# Patient Record
Sex: Female | Born: 1980 | Race: White | Hispanic: No | Marital: Married | Smoking: Never smoker
Health system: Southern US, Community
[De-identification: ages and names within clinical notes are randomized; demographics above are authoritative.]

---

## 2006-04-19 ENCOUNTER — Inpatient Hospital Stay: Payer: Self-pay | Admitting: Unknown Physician Specialty

## 2007-02-11 ENCOUNTER — Ambulatory Visit: Payer: Self-pay | Admitting: Gynecology

## 2007-02-17 ENCOUNTER — Ambulatory Visit (HOSPITAL_COMMUNITY): Admission: RE | Admit: 2007-02-17 | Discharge: 2007-02-17 | Payer: Self-pay | Admitting: Gynecology

## 2007-02-24 ENCOUNTER — Ambulatory Visit: Payer: Self-pay | Admitting: Family Medicine

## 2007-02-24 ENCOUNTER — Encounter (INDEPENDENT_AMBULATORY_CARE_PROVIDER_SITE_OTHER): Payer: Self-pay | Admitting: Gynecology

## 2007-03-24 ENCOUNTER — Ambulatory Visit: Payer: Self-pay | Admitting: Family Medicine

## 2007-04-09 ENCOUNTER — Ambulatory Visit (HOSPITAL_COMMUNITY): Admission: RE | Admit: 2007-04-09 | Discharge: 2007-04-09 | Payer: Self-pay | Admitting: Gynecology

## 2007-04-20 ENCOUNTER — Ambulatory Visit: Payer: Self-pay | Admitting: Gynecology

## 2007-05-01 ENCOUNTER — Ambulatory Visit (HOSPITAL_COMMUNITY): Admission: RE | Admit: 2007-05-01 | Discharge: 2007-05-01 | Payer: Self-pay | Admitting: Gynecology

## 2007-05-11 ENCOUNTER — Ambulatory Visit (HOSPITAL_COMMUNITY): Admission: RE | Admit: 2007-05-11 | Discharge: 2007-05-11 | Payer: Self-pay | Admitting: Gynecology

## 2007-05-18 ENCOUNTER — Ambulatory Visit: Payer: Self-pay | Admitting: Gynecology

## 2007-06-12 ENCOUNTER — Ambulatory Visit (HOSPITAL_COMMUNITY): Admission: RE | Admit: 2007-06-12 | Discharge: 2007-06-12 | Payer: Self-pay | Admitting: Gynecology

## 2007-06-15 ENCOUNTER — Ambulatory Visit: Payer: Self-pay | Admitting: Gynecology

## 2007-07-15 ENCOUNTER — Ambulatory Visit: Payer: Self-pay | Admitting: Obstetrics & Gynecology

## 2007-08-06 ENCOUNTER — Ambulatory Visit: Payer: Self-pay | Admitting: Gynecology

## 2007-08-24 ENCOUNTER — Ambulatory Visit: Payer: Self-pay | Admitting: Gynecology

## 2007-09-01 ENCOUNTER — Inpatient Hospital Stay (HOSPITAL_COMMUNITY): Admission: AD | Admit: 2007-09-01 | Discharge: 2007-09-04 | Payer: Self-pay | Admitting: Obstetrics and Gynecology

## 2007-09-01 ENCOUNTER — Encounter: Payer: Self-pay | Admitting: Family Medicine

## 2007-09-01 ENCOUNTER — Ambulatory Visit: Payer: Self-pay | Admitting: Family Medicine

## 2007-10-12 ENCOUNTER — Ambulatory Visit: Payer: Self-pay | Admitting: Gynecology

## 2007-10-12 ENCOUNTER — Encounter (INDEPENDENT_AMBULATORY_CARE_PROVIDER_SITE_OTHER): Payer: Self-pay | Admitting: Gynecology

## 2009-03-07 ENCOUNTER — Encounter: Payer: Self-pay | Admitting: Obstetrics & Gynecology

## 2009-03-07 ENCOUNTER — Ambulatory Visit: Payer: Self-pay | Admitting: Obstetrics & Gynecology

## 2009-08-01 ENCOUNTER — Ambulatory Visit: Payer: Self-pay | Admitting: Family Medicine

## 2010-09-11 NOTE — Op Note (Signed)
NAMEGABRIANA, [name]              ACCOUNT NO.:  [id number]   MEDICAL RECORD NO.:  [id number]          PATIENT TYPE:  INP   LOCATION:  9102                          FACILITY:  [facility]   PHYSICIAN:  Tanya S. Shawnie Pons, M.D.   DATE OF BIRTH:  [birth year]   DATE OF PROCEDURE:  09/01/2007  DATE OF DISCHARGE:                               OPERATIVE REPORT   PREOPERATIVE DIAGNOSES:  1. Intrauterine pregnancy at 34-3/7th weeks.  2. Preterm labor, transverse lie, previous cesarean section.  3. Undesired fertility.   POSTOPERATIVE DIAGNOSES:  1. Intrauterine pregnancy at 34-3/7th weeks.  2. Preterm labor, transverse lie, previous cesarean section.  3. Undesired fertility.   PROCEDURE:  Repeat vertical cesarean section and bilateral tubal  ligation.   SURGEON:  Shelbie Proctor. Shawnie Pons, MD   ASSISTANT:  None.   ANESTHESIA:  Spinal local, Quillian Quince, MD.   FINDINGS:  Viable female infant, Apgars 9 and 9, and weight 6-1/2 pounds.   SPECIMENS:  Placenta to pathology.   ESTIMATED BLOOD LOSS:  1000 mL.   COMPLICATIONS:  None known.   REASON FOR PROCEDURE:  Briefly, the patient is a 30 year old gravida 2,  para 1, who is status post previous C-section for breech preterm labor  at 36 weeks, who came in with acute onset of painful uterine  contractions, to start the day she was monitored for 6 hours and had a  little cervical change but continued painful contractions every 2-4  minutes.  On scanning, it was noted that the baby was transverse lie  back down.  There was no driving wedge to stop labor.  Given the  uncomfortableness and that she made some change, it was felt that she  was probably in early labor; and given her gestational age, it was not  felt she was the candidate for tocolysis.   PROCEDURE:  The patient was taken to the OR.  She was placed in the  supine position on the left lateral tilt.  She was prepped and draped in  usual sterile fashion.  A Foley catheter was placed at the  bladder.  When anesthesia was felt to be adequate, low-transverse incision was  made through a previous scar.  This incision was carried down to the  underlying fascia, which was incised in the midline with electrocautery.  Fascial extension was ensured with scissors.  Kocher clamps were used to  elevate the fascia off the underlying rectus muscle superiorly and  inferiorly, which was dissected off bluntly, laterally and with the  electrocautery in the midline.  Peritoneal cavity was entered.  As the  rectus muscles were separated, the peritoneal cavity was entered  bluntly.  Peritoneum was taken down superiorly with electrocautery.  Alexis retractor was then placed inside the incision.  The bladder flap  was created, this was taken down easily; and then, a vertical incision  was made approximately 6.5 cm long in length from the edge of the  bladder flap, which was in low-transverse incision up into the  myometrium.  The amniotic cavity was encountered, and fluid was noted to  be clear.  The  infant was definitely transverse lie back down.  Reach  for the head was not able to be done, so the baby was delivered breech.  The infant was delivered easily.  The cords clamped x2 and cut.  Spontaneous cry was heard.  The infant was taken to awaiting  pediatricians.  Cord blood was obtained.  Placenta was delivered easily,  and the uterine cavity cleaned with a dry lap pad.  Uterine incision was  closed with 0 Vicryl suture in a running fashion in two layers.  The  serosa of the uterus was closed with a 2-0 Vicryl on a SH in baseball  stitch.  When hemostasis was felt to be adequate, attention was turned  to the tube.  The patient's left tube was grasped with a Babcock clamp  and followed to its fimbriated end.  A rent in the broad ligaments  beneath the tube was noted.  A Filshie clip was placed across the tube  through this rent, and then a suture was used to tie off any bleeders  from this  side.  The patient's right tube was identified and grasped and  brought to the edge of the incision and followed to its fimbriated end.  A Filshie clip was placed across this tube as well.  Tubes were returned  to the peritoneal cavity.  Incision was inspected and was felt to be  hemostatic.  Any bleeders in the peritoneal and rectus muscles were  cauterized.  The fascia was closed with 0 Vicryl suture in a running  fashion.  The subcutaneous tissue was irrigated.  Any bleeders  cauterized, and skin closed using clips.  A 30 mL of 0.25% Marcaine were  then injected about the incision.  All instrument and lap counts were  correct x2.  The patient was awakened and taken to the recovery room in  stable condition.  Of note, the patient had a lot of peritoneal  irritation, and Nesacaine was poured in the peritoneal cavity.  The  patient was also able to feel a little bit of the cauterization that  occurred near the skin towards the end of the case.      Shelbie Proctor. Shawnie Pons, M.D.  Electronically Signed     TSP/MEDQ  D:  09/01/2007  T:  09/02/2007  Job:  [id number]

## 2010-09-11 NOTE — Assessment & Plan Note (Signed)
NAMEJOYELLE, SIEDLECKI NO.:  [id number]   MEDICAL RECORD NO.:  [id number]           PATIENT TYPE:   LOCATION:  [facility] at [facility]           FACILITY:   PHYSICIAN:  Ginger Carne, MD DATE OF BIRTH:  December 02, 1980   DATE OF SERVICE:  10/12/2007                                  CLINIC NOTE   [name] returns today following an elective repeat cesarean section and  bilateral tubal ligation performed on Sep 01, 2007.  She has had no  postoperative complications or concerns.  She has slight vaginal  bleeding at this time.   PHYSICAL EXAMINATION:  Incision is dry and well healed.  Pelvic;  external genitalia, vulva, and vagina are normal.  Cervix smooth without  erosions or lesions.  Pap smear performed.  Uterus is small, anteverted,  and flexed and both adnexa palpable and found to be normal.   IMPRESSION:  Normal postoperative postpartum visit.  The patient  exhibits no signs of depression or has other specific complaints at this  time.           ______________________________  Ginger Carne, MD     SHB/MEDQ  D:  10/12/2007  T:  10/13/2007  Job:  [id number]

## 2010-09-11 NOTE — Discharge Summary (Signed)
[name], [name]              ACCOUNT NO.:  [id number]   MEDICAL RECORD NO.:  [id number]          PATIENT TYPE:  INP   LOCATION:  9102                          FACILITY:  [facility]   PHYSICIAN:  Karlton Lemon, MD      DATE OF BIRTH:  [birth year]   DATE OF ADMISSION:  09/01/2007  DATE OF DISCHARGE:  09/04/2007                               DISCHARGE SUMMARY   ADMISSION DIAGNOSES:  1. Intrauterine pregnancy at 34 weeks 3 days gestation.  2. Spontaneous onset of labor.  3. Transverse back-down presentation.  4. History of previous cesarean section.  5. Desires sterilization.   DISCHARGE DIAGNOSES:  1. Postoperative day #3 after repeat low-transverse cesarean section      and bilateral tubal occlusion.  2. Routine postoperative course.   PROCEDURES:  1. The patient had a repeat cesarean section via classical uterine      incision for labor with infant in transverse back down      presentation.  2. She had bilateral tubal occlusion with Filshie clips which took      place at the time of her cesarean section.   COMPLICATIONS:  None.   CONSULTATIONS:  None.   PERTINENT LABORATORY FINDINGS:  Upon admission, she had a urinalysis  that was negative.  CBC showing white blood cell count 12.1, hemoglobin  9.9, hematocrit 29.8, platelets 320.  RPR was nonreactive.  On  postoperative day #1, white blood cell count was 12.6, hemoglobin was  9.1, hematocrit was 27.5, platelets were 260.   PERTINENT ADMISSION HISTORY:  Ms. [name] is a 30 year old gravida 2,  para 0-1-0-1 presenting to the maternity admissions unit with complaints  of abdominal pain.  She was found to be having regular contractions  every 2 minutes.  Upon examination, she was found to be 1 cm dilated,  75% effaced, and -4 station.  She had a trial of terbutaline, which did  not help with the contraction pain.  She was at 34 weeks of 3 days  gestation and it was determined that she was in preterm labor and was  taken for  repeat C-section via classical incision due to transverse back-  down presentation.   HOSPITAL COURSE:  The patient was admitted and taken for repeat C-  section.  This was performed by Dr. Shawnie Pons with a low-transverse skin  incision of the Pfannenstiel fashion.  The uterine incision was  classical and she delivered a viable infant female weighing 6.5 pounds  with Apgars of 9 at 1 minute and 9 at 5 minutes.  The patient tolerated  the procedure well and had an uncomplicated postpartum course.  Her  hemoglobin did drop from 9.9 upon admission to 9.1 at discharge.  She  was ambulating, tolerating p.o., voiding, stooling without difficulty.  Vitals were stable throughout her postoperative course and physical  examination was normal.  She was in stable condition for discharge.   DISCHARGE STATUS:  Stable.   DISCHARGE MEDICATIONS:  1. Continue prenatal vitamins 1 daily.  2. Ferrous sulfate 325 mg 1 tablet by mouth twice daily for 1 month  history.  3. Colace 100 mg 1 tablet by mouth twice daily for 1 month.  4. Percocet 5/325 one to two tablets every 4-6 hours as needed for      pain.  5. Motrin 600 mg 1 tablet by mouth every 6 hours as needed for pain.   DISCHARGE INSTRUCTIONS:  1. Discharge to home.  2. Regular diet.  3. No sexual activity for 6 weeks.  4. No lifting greater than 10 pounds for 6 weeks.  5. Staples are to be removed prior to discharge.  6. The patient does not need RhoGAM as the infant's blood type was Rh      negative.  7. She is to follow up with the [facility] in [place] for      her postoperative care.      Karlton Lemon, MD  Electronically Signed     NS/MEDQ  D:  09/04/2007  T:  09/04/2007  Job:  [id number]

## 2010-09-11 NOTE — Assessment & Plan Note (Signed)
[name], [name]              ACCOUNT NO.:  [id number]   MEDICAL RECORD NO.:  [id number]          PATIENT TYPE:  POB   LOCATION:  [facility]         FACILITY:  [facility]   PHYSICIAN:  Scheryl Darter, MD       DATE OF BIRTH:  01/28/1981   DATE OF SERVICE:  03/07/2009                                  CLINIC NOTE   The patient comes for yearly exam.  The patient is a 30 year old, white  female, gravida 2, para 2, status post tubal ligation with her last  delivery by cesarean section in May 2009.  She has been having regular  menses.  Last menstrual period October 17.  About 3 weeks ago, she  developed some sharp needle like pain in the nipple of the left breast  which seems to be improving somewhat recently.  She says that there is a  small pimple in the breast which she says that has persisted.  No  swelling or mass.  No nipple discharge.  No symptoms on the right side.   PAST MEDICAL HISTORY:  Anemia with her previous pregnancy.   PAST SURGICAL HISTORY:  Two cesarean sections.   FAMILY HISTORY:  Ovarian cancer in grandmother.  No breast cancer.   MEDICATIONS:  None.   ALLERGIES:  No known drug allergies.   SOCIAL HISTORY:  The patient is married.  She denies alcohol, tobacco,  or drug use.   REVIEW OF SYSTEMS:  No shortness of breath, chest pain, cough, abdominal  symptoms, vaginal discharge, abnormal bleeding, or urinary complaints.   PHYSICAL EXAMINATION:  GENERAL:  The patient is in no acute distress.  VITAL SIGNS:  Weight is 170 pounds, height 5 feet, 7 inches, BP 126/70,  normal affect.  No acute distress.  CHEST:  Clear.  HEART:  Regular rhythm.  No thyromegaly.  BREASTS:  Symmetric.  There is a tiny about 2-mm slightly pigmented area  at the edge of the left nipple on the lateral side with no tenderness or  mass or lymphadenopathy.  Right side likewise has no mass or palpable  lymph nodes.  ABDOMEN:  Soft, nontender, no mass.  EXTREMITIES:  Showed no  swelling.  PELVIC:  External genitalia, vagina, and cervix normal.  Pap was done.  Uterus normal size.  No adnexal masses or tenderness.   IMPRESSION:  Breast pain which may have been due to skin process which  may be resolving.  Exam is not suspicious today.  She will return in 1  month and we will review her symptoms and repeat an examination.  She is  doing well and can return for yearly exams.      Scheryl Darter, MD     JA/MEDQ  D:  03/07/2009  T:  03/08/2009  Job:  [id number]

## 2011-01-24 ENCOUNTER — Ambulatory Visit: Payer: Self-pay | Admitting: Family Medicine

## 2011-01-24 IMAGING — CR DG KNEE COMPLETE 4+V*L*
1 series · 4 of 4 positions shown · non-contrast
Comparison: none

REASON FOR EXAM: left knee pain
COMMENTS:

PROCEDURE:     KDR - KDXR KNEE LT COMP WITH OBLIQUES  - [DATE] [DATE]
RESULT:     No fracture, dislocation or other acute bony abnormality is
identified. The knee joint space is well maintained. The patella is intact.

[Series 1: view not recorded · 0.17mm/px · 4 of 4 slices shown]
[im 1/4]
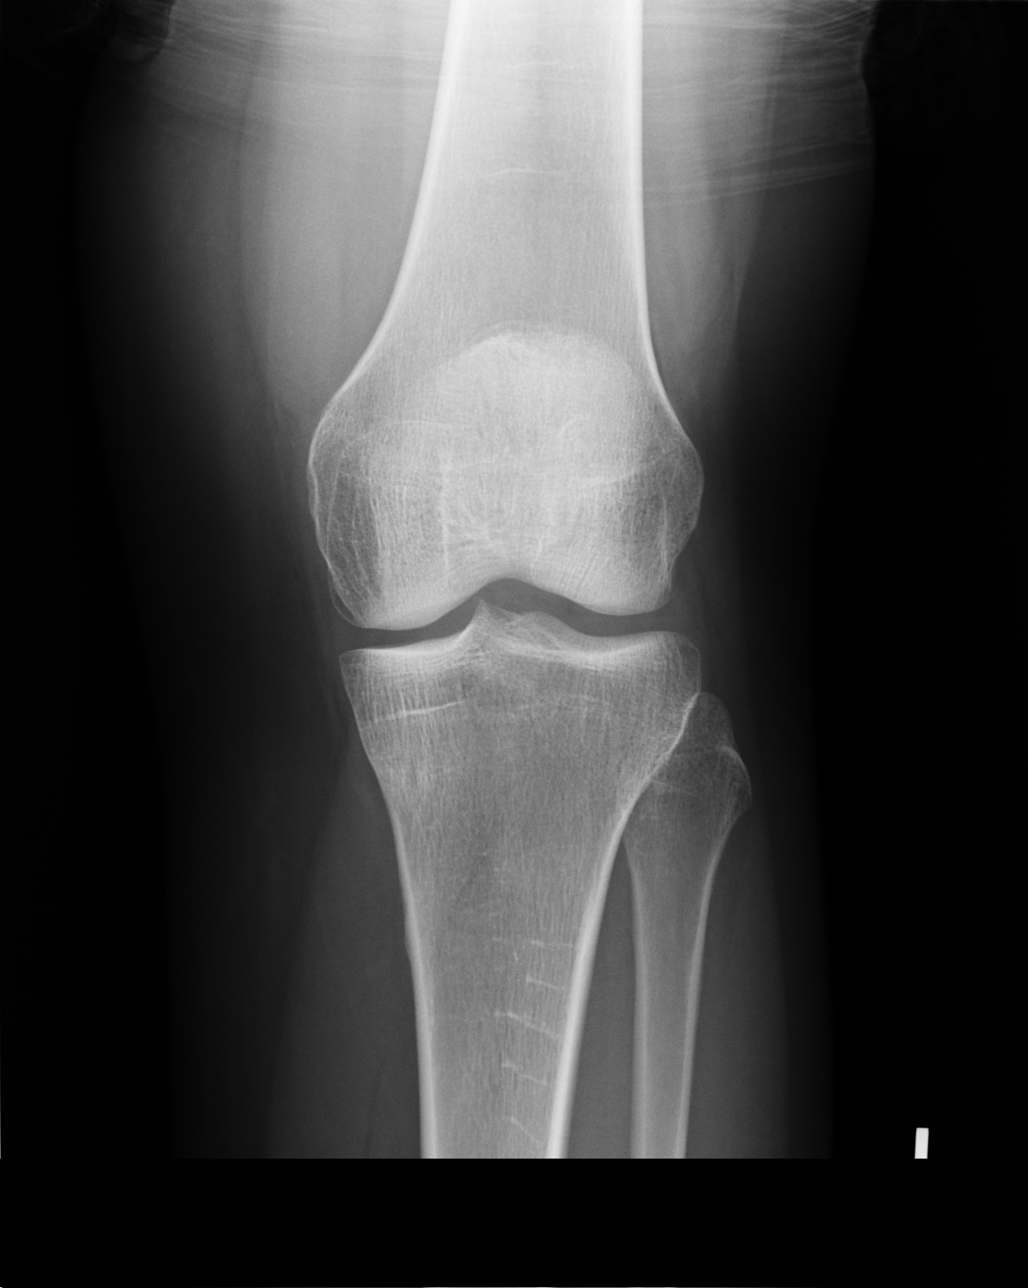
[im 2/4]
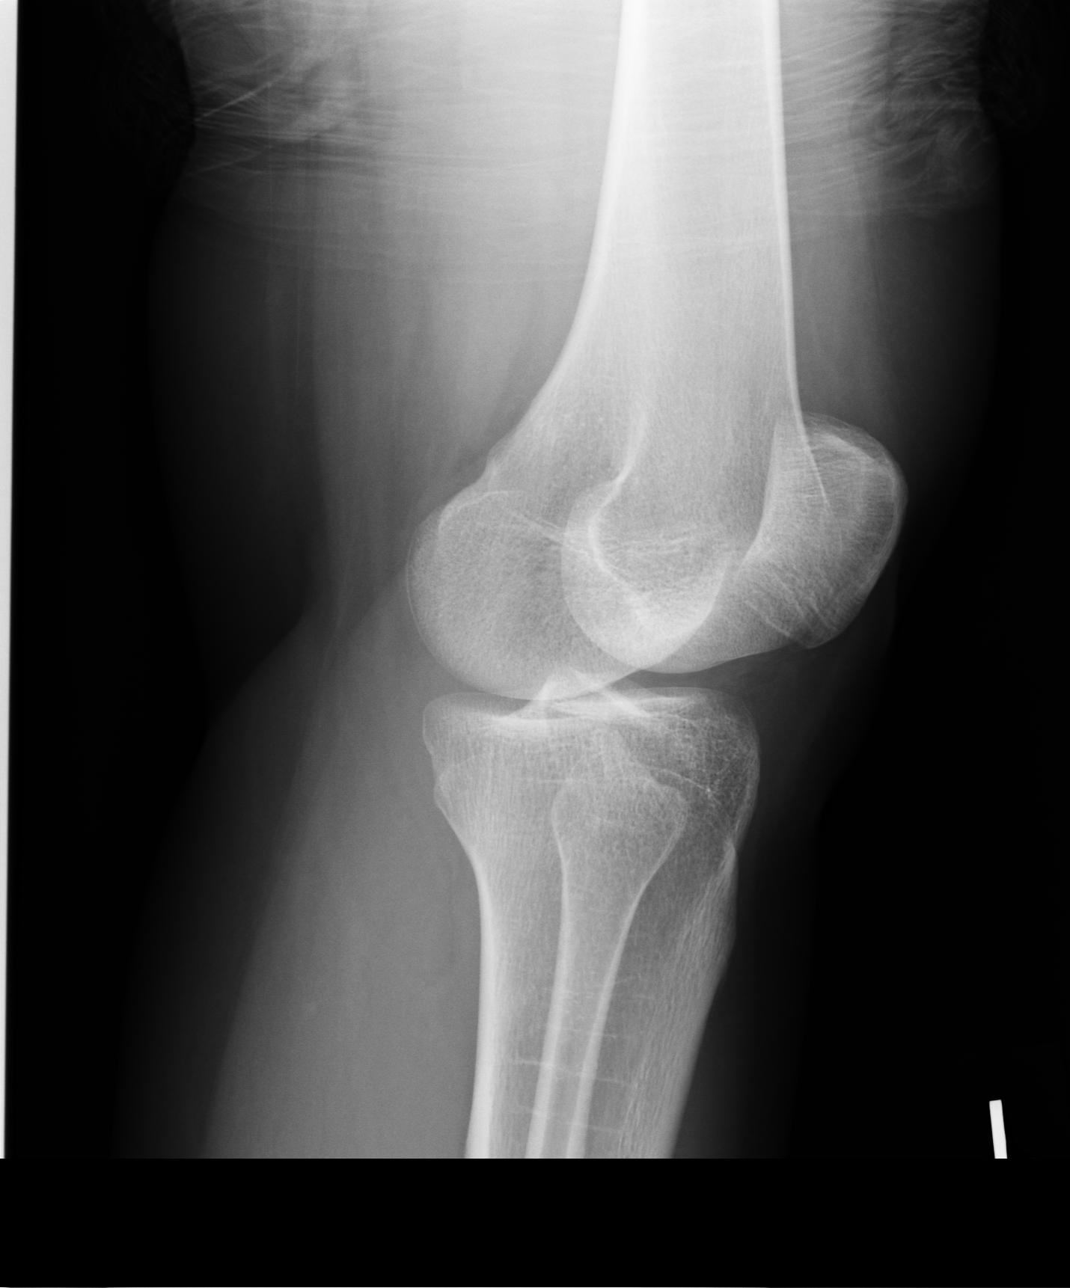
[im 3/4]
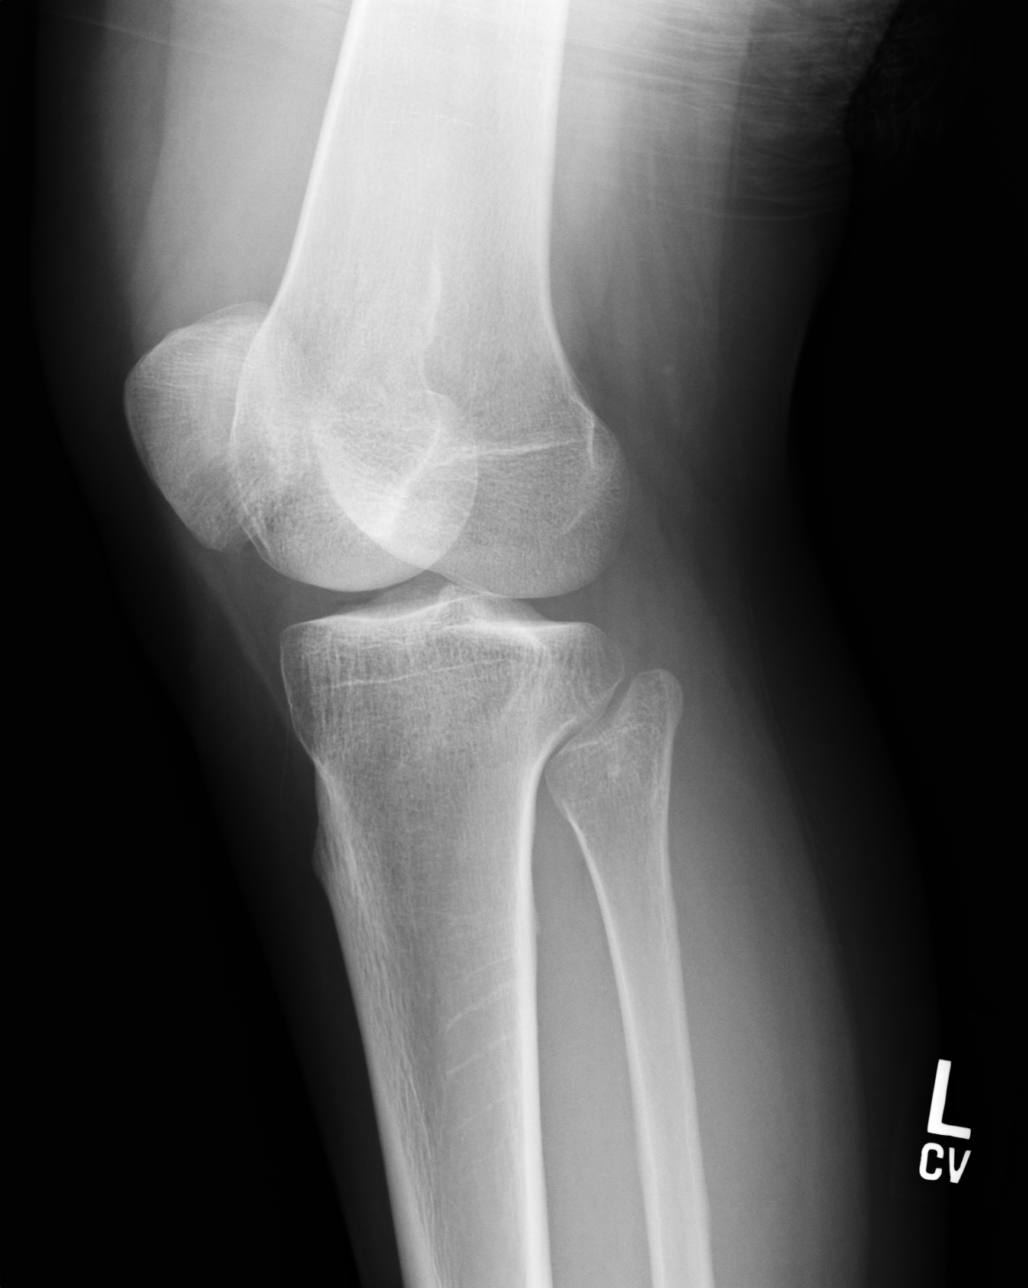
[im 4/4]
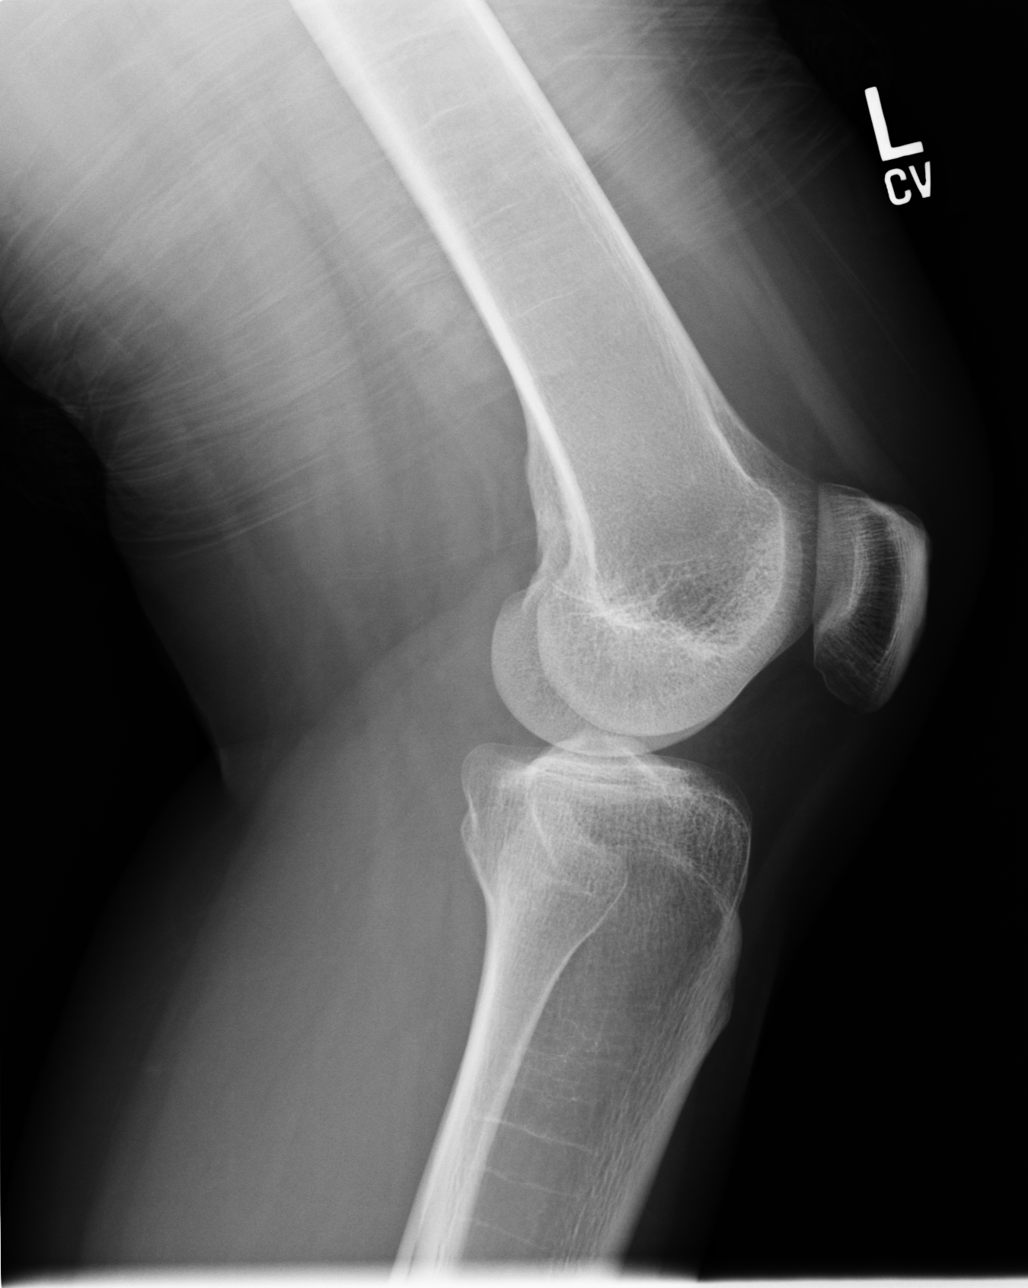

[4 of 4 positions shown; findings below may reference images not displayed]

IMPRESSION: 1.     No significant abnormalities are noted.

## 2016-03-28 ENCOUNTER — Encounter: Payer: Self-pay | Admitting: Family Medicine

## 2016-03-28 ENCOUNTER — Telehealth: Payer: Self-pay | Admitting: Family Medicine

## 2016-03-28 NOTE — Telephone Encounter (Signed)
May re-establish as a patient here.

## 2016-03-28 NOTE — Telephone Encounter (Signed)
Patient advised Maurine MinisterDennis is out of the office this afternoon, and it would have to be approved for her to be reestablished as a new patient. Patient verbalized understanding.

## 2016-03-28 NOTE — Telephone Encounter (Signed)
Pt would like to be re-established with Maurine Ministerennis. Pt's last OV was 04/05/11. Pt stated that no changes in her medical condition or medications. Pt stated that she is usually healthy and that is why she hasn't been in the office and hasn't seen any other PCP. Pt stated that her husband is a pt here and she was calling to be seen today if possible for an itchy pain area on lower leg but no rash. I advised pt I would send back the message but wasn't sure when we would have an answer for her and she may want to see urgent care for this issue and we would call her back once we had an answer from [place]. Please advise. Thanks TNP

## 2016-03-28 NOTE — Telephone Encounter (Signed)
Pt mom is calling back.  ZO#[phone]/WJCB#[phone]/MW

## 2016-03-28 NOTE — Telephone Encounter (Signed)
Please review message below. KW 

## 2016-03-29 NOTE — Telephone Encounter (Signed)
Left message for pt to call back/MW °

## 2017-01-13 ENCOUNTER — Other Ambulatory Visit: Payer: Self-pay | Admitting: Orthopedic Surgery

## 2017-01-13 DIAGNOSIS — M5412 Radiculopathy, cervical region: Secondary | ICD-10-CM

## 2017-01-13 DIAGNOSIS — M542 Cervicalgia: Secondary | ICD-10-CM

## 2017-01-21 ENCOUNTER — Ambulatory Visit
Admission: RE | Admit: 2017-01-21 | Discharge: 2017-01-21 | Disposition: A | Discharge status: Home / Self Care | Payer: BLUE CROSS/BLUE SHIELD | Source: Ambulatory Visit | Attending: Orthopedic Surgery | Admitting: Orthopedic Surgery

## 2017-01-21 DIAGNOSIS — M542 Cervicalgia: Secondary | ICD-10-CM

## 2017-01-21 DIAGNOSIS — M5412 Radiculopathy, cervical region: Secondary | ICD-10-CM

## 2017-01-28 ENCOUNTER — Other Ambulatory Visit: Payer: Self-pay

## 2017-01-28 ENCOUNTER — Other Ambulatory Visit: Payer: Self-pay | Admitting: Orthopedic Surgery

## 2017-01-28 DIAGNOSIS — M5412 Radiculopathy, cervical region: Secondary | ICD-10-CM

## 2017-02-11 ENCOUNTER — Encounter: Payer: Self-pay | Admitting: Radiology

## 2017-02-11 ENCOUNTER — Ambulatory Visit
Admission: RE | Admit: 2017-02-11 | Discharge: 2017-02-11 | Disposition: A | Discharge status: Home / Self Care | Payer: BLUE CROSS/BLUE SHIELD | Source: Ambulatory Visit | Attending: Orthopedic Surgery | Admitting: Orthopedic Surgery

## 2017-02-11 DIAGNOSIS — M5412 Radiculopathy, cervical region: Secondary | ICD-10-CM

## 2017-02-11 MED ORDER — TRIAMCINOLONE ACETONIDE 40 MG/ML IJ SUSP (RADIOLOGY)
60.0000 mg | Freq: Once | INTRAMUSCULAR | Status: AC
  Administered 2017-02-11: 60 mg via EPIDURAL

## 2017-02-11 MED ORDER — IOPAMIDOL (ISOVUE-M 300) INJECTION 61%
1.0000 mL | Freq: Once | INTRAMUSCULAR | Status: AC | PRN
  Administered 2017-02-11: 1 mL via EPIDURAL

## 2017-02-11 NOTE — Discharge Instructions (Signed)

## 2019-01-13 ENCOUNTER — Other Ambulatory Visit: Payer: Self-pay | Admitting: *Deleted

## 2019-01-13 DIAGNOSIS — Z20822 Contact with and (suspected) exposure to covid-19: Secondary | ICD-10-CM

## 2019-01-14 LAB — NOVEL CORONAVIRUS, NAA: SARS-CoV-2, NAA: NOT DETECTED

## 2019-07-09 ENCOUNTER — Ambulatory Visit: Payer: BC Managed Care – PPO | Attending: Internal Medicine

## 2019-07-09 DIAGNOSIS — Z23 Encounter for immunization: Secondary | ICD-10-CM

## 2019-07-09 NOTE — Progress Notes (Signed)
   Covid-19 Vaccination Clinic  Name:  [name]    MRN: [id number] DOB: 08-06-1980  07/09/2019  [name] was observed post Covid-19 immunization for 15 minutes without incident. She was provided with Vaccine Information Sheet and instruction to access the V-Safe system.   [name] was instructed to call 911 with any severe reactions post vaccine: Marland Kitchen Difficulty breathing  . Swelling of face and throat  . A fast heartbeat  . A bad rash all over body  . Dizziness and weakness   Immunizations Administered    Name Date Dose VIS Date Route   Moderna COVID-19 Vaccine 07/09/2019  2:38 AM 0.5 mL 03/30/2019 Intramuscular   Manufacturer: Moderna   Lot: [id number]   NDC: 49753-005-11

## 2019-08-10 ENCOUNTER — Ambulatory Visit: Payer: BC Managed Care – PPO | Attending: Internal Medicine

## 2019-08-10 DIAGNOSIS — Z23 Encounter for immunization: Secondary | ICD-10-CM

## 2019-08-10 NOTE — Progress Notes (Signed)
   Covid-19 Vaccination Clinic  Name:  [name]    MRN: [id number] DOB: 1981-02-22  08/10/2019  [name] was observed post Covid-19 immunization for 15 minutes without incident. She was provided with Vaccine Information Sheet and instruction to access the V-Safe system.   [name] was instructed to call 911 with any severe reactions post vaccine: Marland Kitchen Difficulty breathing  . Swelling of face and throat  . A fast heartbeat  . A bad rash all over body  . Dizziness and weakness   Immunizations Administered    Name Date Dose VIS Date Route   Moderna COVID-19 Vaccine 08/10/2019 10:35 AM 0.5 mL 03/30/2019 Intramuscular   Manufacturer: Moderna   Lot: [id number]   NDC: 31121-624-46

## 2020-03-29 DIAGNOSIS — Z20822 Contact with and (suspected) exposure to covid-19: Secondary | ICD-10-CM | POA: Diagnosis not present

## 2021-11-14 ENCOUNTER — Telehealth: Payer: BC Managed Care – PPO | Admitting: Physician Assistant

## 2021-11-14 DIAGNOSIS — B9689 Other specified bacterial agents as the cause of diseases classified elsewhere: Secondary | ICD-10-CM

## 2021-11-14 DIAGNOSIS — J208 Acute bronchitis due to other specified organisms: Secondary | ICD-10-CM

## 2021-11-14 MED ORDER — BENZONATATE 100 MG PO CAPS: 100.0000 mg | ORAL_CAPSULE | Freq: Three times a day (TID) | ORAL | 0 refills | Status: DC | PRN

## 2021-11-14 MED ORDER — AZITHROMYCIN 250 MG PO TABS: ORAL_TABLET | ORAL | 0 refills | Status: AC

## 2021-11-14 MED ORDER — ALBUTEROL SULFATE HFA 108 (90 BASE) MCG/ACT IN AERS: 2.0000 | INHALATION_SPRAY | Freq: Four times a day (QID) | RESPIRATORY_TRACT | 0 refills | Status: DC | PRN

## 2021-11-14 NOTE — Progress Notes (Signed)
We are sorry that you are not feeling well.  Here is how we plan to help!  Based on your presentation I believe you most likely have A cough due to bacteria.  When patients have a productive cough with a change in color or increased sputum production, we are concerned about bacterial bronchitis.  If left untreated it can progress to pneumonia.  If your symptoms do not improve with your treatment plan it is important that you contact your provider.   I have prescribed Azithromyin 250 mg: two tablets now and then one tablet daily for 4 additonal days    In addition you may use A prescription cough medication called Tessalon Perles 100mg . You may take 1-2 capsules every 8 hours as needed for your cough. I have also sent in an albuterol inhaler to use as directed, if needed, for wheezing.   From your responses in the eVisit questionnaire you describe inflammation in the upper respiratory tract which is causing a significant cough.  This is commonly called Bronchitis and has four common causes:   Allergies Viral Infections Acid Reflux Bacterial Infection Allergies, viruses and acid reflux are treated by controlling symptoms or eliminating the cause. An example might be a cough caused by taking certain blood pressure medications. You stop the cough by changing the medication. Another example might be a cough caused by acid reflux. Controlling the reflux helps control the cough.  USE OF BRONCHODILATOR ("RESCUE") INHALERS: There is a risk from using your bronchodilator too frequently.  The risk is that over-reliance on a medication which only relaxes the muscles surrounding the breathing tubes can reduce the effectiveness of medications prescribed to reduce swelling and congestion of the tubes themselves.  Although you feel brief relief from the bronchodilator inhaler, your asthma may actually be worsening with the tubes becoming more swollen and filled with mucus.  This can delay other crucial treatments,  such as oral steroid medications. If you need to use a bronchodilator inhaler daily, several times per day, you should discuss this with your provider.  There are probably better treatments that could be used to keep your asthma under control.     HOME CARE Only take medications as instructed by your medical team. Complete the entire course of an antibiotic. Drink plenty of fluids and get plenty of rest. Avoid close contacts especially the very young and the elderly Cover your mouth if you cough or cough into your sleeve. Always remember to wash your hands A steam or ultrasonic humidifier can help congestion.   GET HELP RIGHT AWAY IF: You develop worsening fever. You become short of breath You cough up blood. Your symptoms persist after you have completed your treatment plan MAKE SURE YOU  Understand these instructions. Will watch your condition. Will get help right away if you are not doing well or get worse.    Thank you for choosing an e-visit.  Your e-visit answers were reviewed by a board certified advanced clinical practitioner to complete your personal care plan. Depending upon the condition, your plan could have included both over the counter or prescription medications.  Please review your pharmacy choice. Make sure the pharmacy is open so you can pick up prescription now. If there is a problem, you may contact your provider through and have the prescription routed to another pharmacy.  Your safety is important to [employer]. If you have drug allergies check your prescription carefully.   For the next 24 hours you can use MyChart  to ask questions about today's visit, request a non-urgent call back, or ask for a work or school excuse. You will get an email in the next two days asking about your experience. I hope that your e-visit has been valuable and will speed your recovery.

## 2021-11-14 NOTE — Progress Notes (Signed)
I have spent 5 minutes in review of e-visit questionnaire, review and updating patient chart, medical decision making and response to patient.   Idil Maslanka Cody Marcellas Marchant, PA-C    

## 2022-04-19 ENCOUNTER — Telehealth: Payer: BC Managed Care – PPO | Admitting: Physician Assistant

## 2022-04-19 DIAGNOSIS — B9689 Other specified bacterial agents as the cause of diseases classified elsewhere: Secondary | ICD-10-CM

## 2022-04-19 DIAGNOSIS — J019 Acute sinusitis, unspecified: Secondary | ICD-10-CM

## 2022-04-19 MED ORDER — AMOXICILLIN-POT CLAVULANATE 875-125 MG PO TABS: 1.0000 | ORAL_TABLET | Freq: Two times a day (BID) | ORAL | 0 refills | Status: DC

## 2022-04-19 NOTE — Progress Notes (Signed)

## 2023-08-06 ENCOUNTER — Telehealth: Admitting: Physician Assistant

## 2023-08-06 DIAGNOSIS — B9789 Other viral agents as the cause of diseases classified elsewhere: Secondary | ICD-10-CM | POA: Diagnosis not present

## 2023-08-06 DIAGNOSIS — J019 Acute sinusitis, unspecified: Secondary | ICD-10-CM | POA: Diagnosis not present

## 2023-08-06 MED ORDER — IPRATROPIUM BROMIDE 0.03 % NA SOLN: 2.0000 | Freq: Two times a day (BID) | NASAL | 0 refills | Status: AC

## 2023-08-06 NOTE — Progress Notes (Signed)

## 2023-08-06 NOTE — Progress Notes (Signed)
 I have spent 5 minutes in review of e-visit questionnaire, review and updating patient chart, medical decision making and response to patient.   Piedad Climes, PA-C
